# Patient Record
Sex: Female | Born: 1960 | Race: White | Hispanic: No | Marital: Married | Smoking: Never smoker
Health system: Southern US, Community
[De-identification: ages and names within clinical notes are randomized; demographics above are authoritative.]

## PROBLEM LIST (undated history)

## (undated) DIAGNOSIS — I341 Nonrheumatic mitral (valve) prolapse: Secondary | ICD-10-CM

## (undated) DIAGNOSIS — G250 Essential tremor: Secondary | ICD-10-CM

## (undated) DIAGNOSIS — R251 Tremor, unspecified: Secondary | ICD-10-CM

## (undated) HISTORY — DX: Tremor, unspecified: R25.1

## (undated) HISTORY — DX: Nonrheumatic mitral (valve) prolapse: I34.1

## (undated) HISTORY — PX: OTHER SURGICAL HISTORY: SHX169

## (undated) HISTORY — PX: ABDOMINAL HYSTERECTOMY: SHX81

## (undated) HISTORY — PX: SKIN CANCER EXCISION: SHX779

---
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
## 2001-02-01 ENCOUNTER — Other Ambulatory Visit: Admission: RE | Admit: 2001-02-01 | Discharge: 2001-02-01 | Payer: Self-pay | Admitting: Obstetrics and Gynecology

## 2001-02-25 ENCOUNTER — Encounter (INDEPENDENT_AMBULATORY_CARE_PROVIDER_SITE_OTHER): Payer: Self-pay

## 2001-02-25 ENCOUNTER — Other Ambulatory Visit: Admission: RE | Admit: 2001-02-25 | Discharge: 2001-02-25 | Payer: Self-pay | Admitting: Obstetrics and Gynecology

## 2003-04-05 ENCOUNTER — Other Ambulatory Visit: Admission: RE | Admit: 2003-04-05 | Discharge: 2003-04-05 | Payer: Self-pay | Admitting: Obstetrics and Gynecology

## 2003-04-12 ENCOUNTER — Encounter: Admission: RE | Admit: 2003-04-12 | Discharge: 2003-04-12 | Payer: Self-pay | Admitting: Obstetrics and Gynecology

## 2003-04-12 ENCOUNTER — Encounter: Payer: Self-pay | Admitting: Obstetrics and Gynecology

## 2004-04-07 ENCOUNTER — Other Ambulatory Visit: Admission: RE | Admit: 2004-04-07 | Discharge: 2004-04-07 | Payer: Self-pay | Admitting: Obstetrics and Gynecology

## 2005-05-14 ENCOUNTER — Other Ambulatory Visit: Admission: RE | Admit: 2005-05-14 | Discharge: 2005-05-14 | Payer: Self-pay | Admitting: Obstetrics and Gynecology

## 2005-05-29 ENCOUNTER — Encounter (INDEPENDENT_AMBULATORY_CARE_PROVIDER_SITE_OTHER): Payer: Self-pay | Admitting: *Deleted

## 2005-05-30 ENCOUNTER — Inpatient Hospital Stay (HOSPITAL_COMMUNITY): Admission: RE | Admit: 2005-05-30 | Discharge: 2005-05-31 | Payer: Self-pay | Admitting: Obstetrics and Gynecology

## 2006-07-01 ENCOUNTER — Encounter: Admission: RE | Admit: 2006-07-01 | Discharge: 2006-07-01 | Payer: Self-pay | Admitting: Obstetrics and Gynecology

## 2007-05-03 ENCOUNTER — Ambulatory Visit (HOSPITAL_BASED_OUTPATIENT_CLINIC_OR_DEPARTMENT_OTHER): Admission: RE | Admit: 2007-05-03 | Discharge: 2007-05-03 | Payer: Self-pay | Admitting: Orthopedic Surgery

## 2008-03-19 ENCOUNTER — Encounter: Admission: RE | Admit: 2008-03-19 | Discharge: 2008-03-19 | Payer: Self-pay | Admitting: Family Medicine

## 2008-03-29 ENCOUNTER — Encounter: Admission: RE | Admit: 2008-03-29 | Discharge: 2008-03-29 | Payer: Self-pay | Admitting: Family Medicine

## 2008-06-25 ENCOUNTER — Other Ambulatory Visit: Admission: RE | Admit: 2008-06-25 | Discharge: 2008-06-25 | Payer: Self-pay | Admitting: Obstetrics and Gynecology

## 2009-07-09 ENCOUNTER — Other Ambulatory Visit: Admission: RE | Admit: 2009-07-09 | Discharge: 2009-07-09 | Payer: Self-pay | Admitting: Obstetrics and Gynecology

## 2009-07-16 ENCOUNTER — Encounter: Admission: RE | Admit: 2009-07-16 | Discharge: 2009-07-16 | Payer: Self-pay | Admitting: Obstetrics and Gynecology

## 2010-08-01 DIAGNOSIS — C4491 Basal cell carcinoma of skin, unspecified: Secondary | ICD-10-CM

## 2010-08-01 HISTORY — DX: Basal cell carcinoma of skin, unspecified: C44.91

## 2010-08-19 IMAGING — MG MM DIAGNOSTIC BILATERAL
6 series · 6 of 6 positions shown · non-contrast
Comparison: 04/12/2003, 07/01/2006, 03/19/2008

CLINICAL DATA: The patient has been experiencing tenderness
diffusely throughout the right lower outer quadrant intermittently
for 2 weeks.

DIGITAL DIAGNOSTIC  BILATERAL  MAMMOGRAM  WITH CAD AND RIGHT BREAST
ULTRASOUND:

[R CC (1 of 2)]
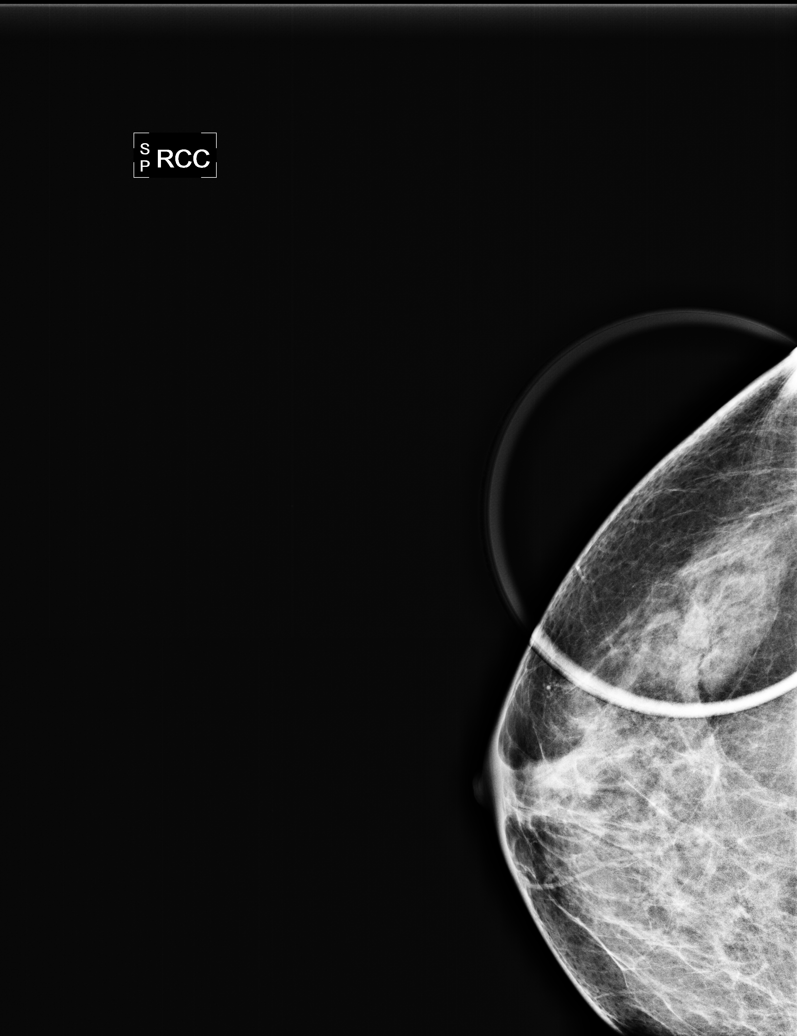

[R CC (2 of 2)]
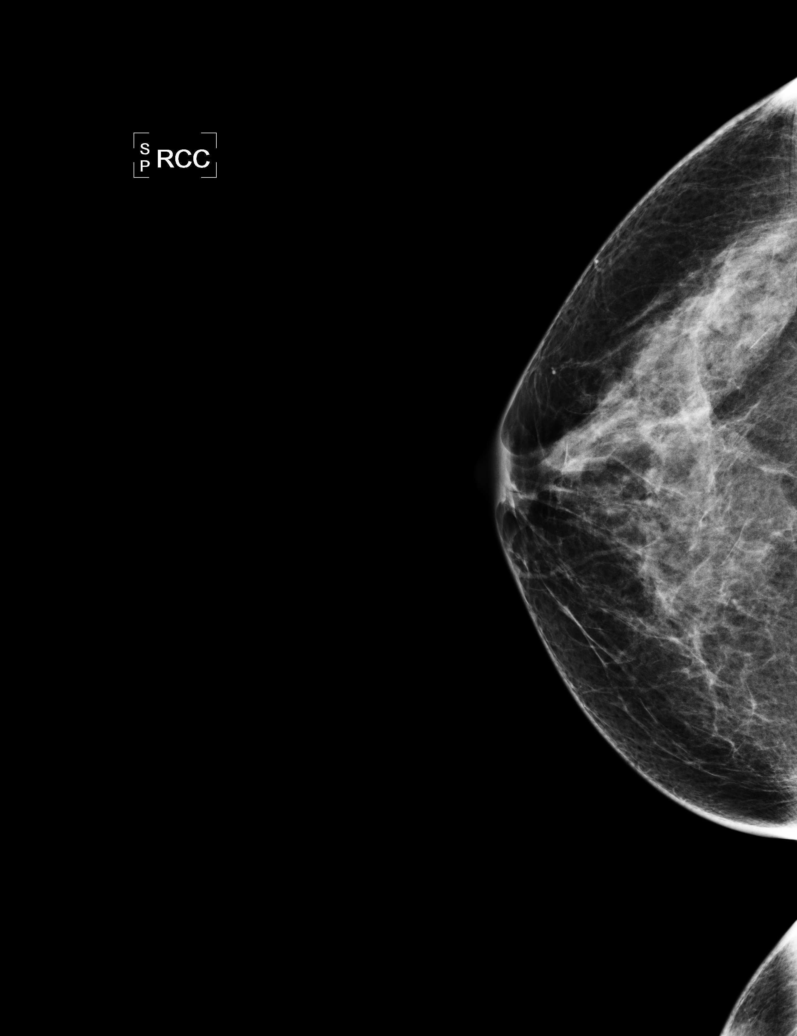

[L CC]
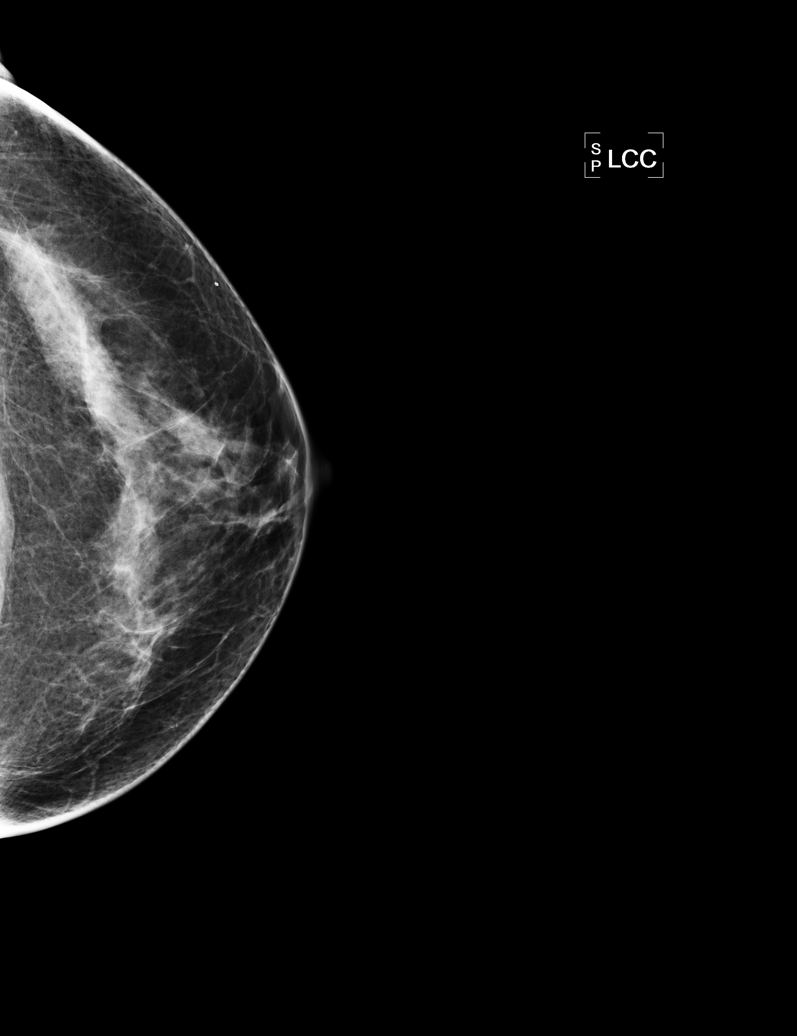

[L MLO]
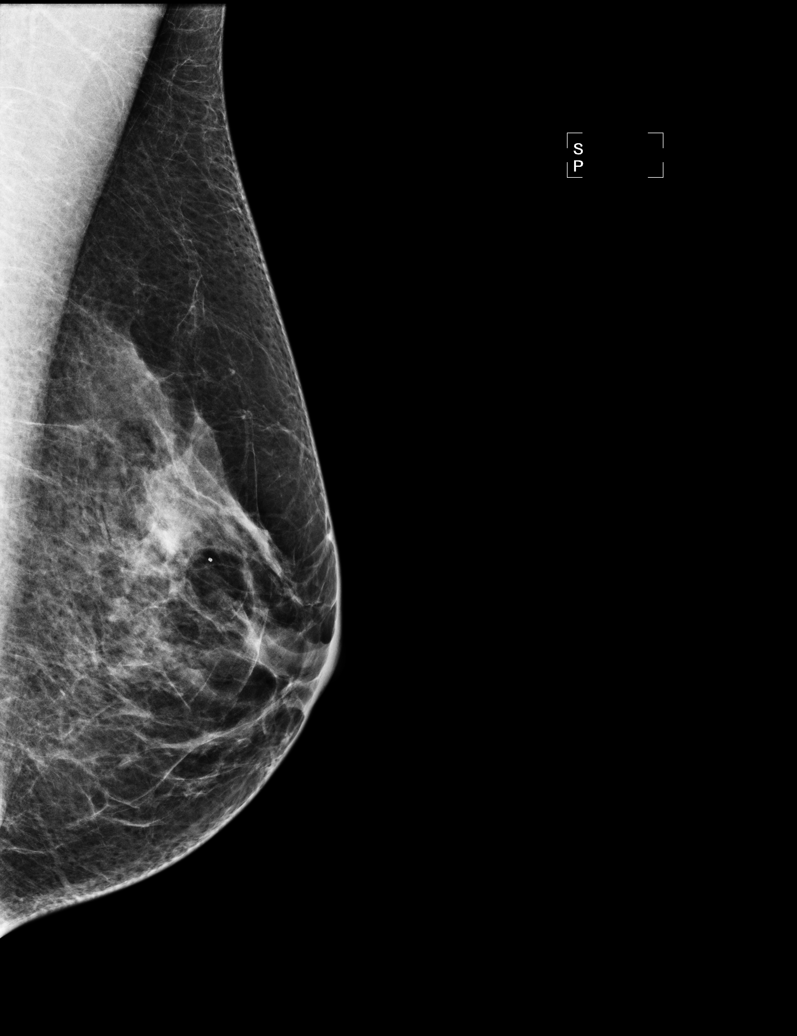

[R MLO (1 of 2)]
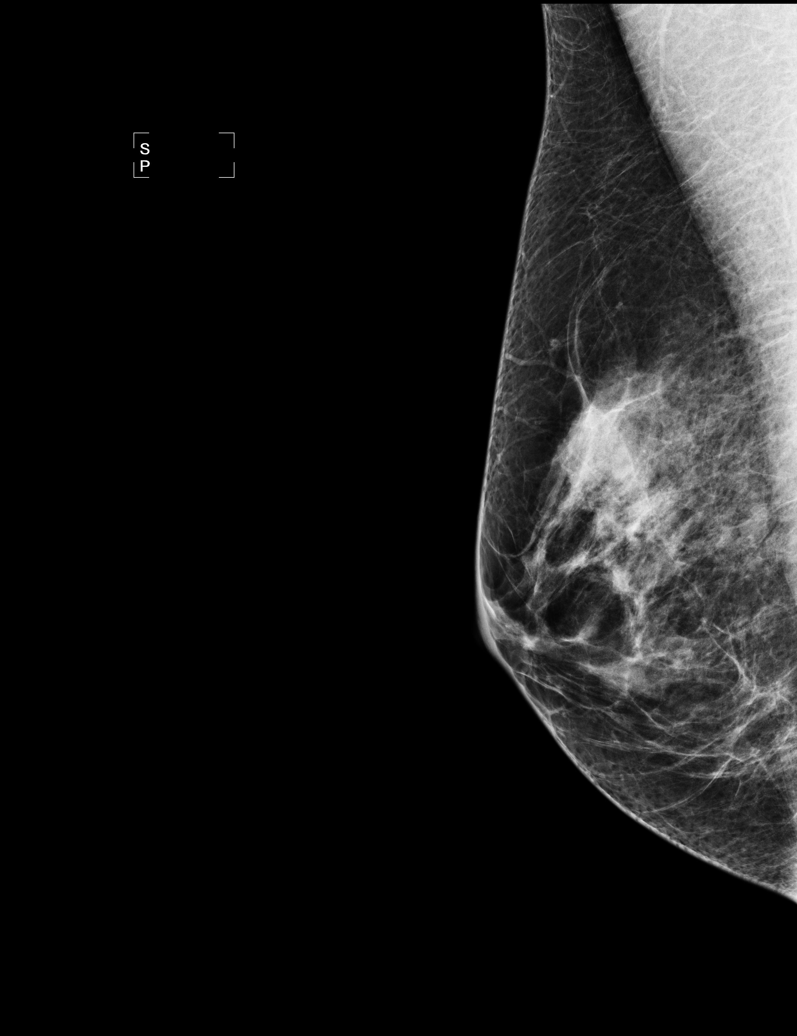

[R MLO (2 of 2)]
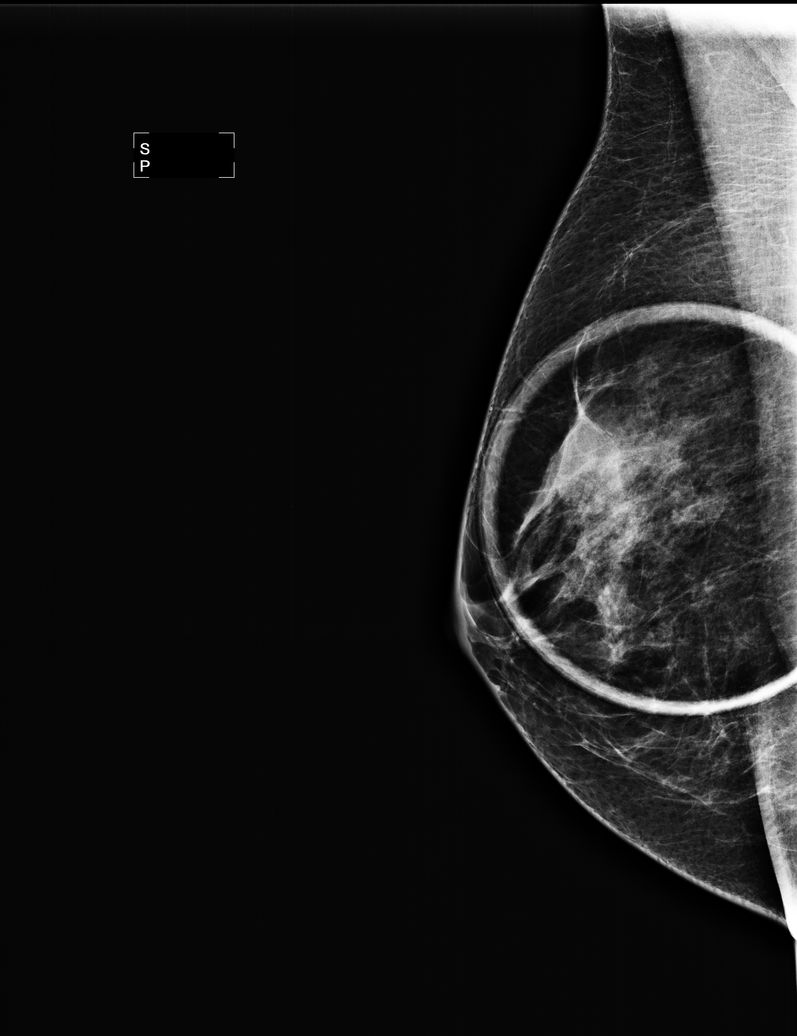

[6 of 6 positions shown; findings below may reference images not displayed]

FINDINGS: There are scattered fibroglandular densities.  There is
an oval, partially obscured, partially circumscribed nodule in the
outer portion of the right breast posteriorly. With the benefit of
retrospection, this has been present on prior exams.  No other
abnormality is noted.
Mammographic images were processed with CAD.

On physical exam, no mass is palpated in the outer portion of the
right breast.

Ultrasound is performed, showing an oval homogeneous well-defined
solid mass at 8:30 o'clock 5 cm from the right nipple, measuring
0.7 x 0.3 x 0.5 cm.  Given the lack of interval change
mammographically dating back to 1228, this is felt to be consistent
with a benign fibroadenoma.  There is no suspicious mass,
distortion or shadowing to suggest malignancy.
IMPRESSION: No mammographic or sonographic evidence of malignancy.  Incidental
note is made of a benign appearing nodule in the right lower outer
quadrant posteriorly which is felt to be consistent with a
fibroadenoma.  Yearly screening mammography is suggested.

BI-RADS CATEGORY 2:  Benign finding(s).

## 2010-09-12 ENCOUNTER — Encounter: Admission: RE | Admit: 2010-09-12 | Discharge: 2010-09-12 | Payer: Self-pay | Admitting: Obstetrics and Gynecology

## 2010-09-26 ENCOUNTER — Encounter: Admission: RE | Admit: 2010-09-26 | Discharge: 2010-09-26 | Payer: Self-pay | Admitting: Obstetrics and Gynecology

## 2010-10-23 ENCOUNTER — Other Ambulatory Visit: Admission: RE | Admit: 2010-10-23 | Discharge: 2010-10-23 | Payer: Self-pay | Admitting: Obstetrics and Gynecology

## 2011-01-04 ENCOUNTER — Encounter: Payer: Self-pay | Admitting: Obstetrics and Gynecology

## 2011-01-04 ENCOUNTER — Encounter: Payer: Self-pay | Admitting: Family Medicine

## 2011-05-01 NOTE — H&P (Signed)
Jill Young, Jill Young             ACCOUNT NO.:  [id number]   MEDICAL RECORD NO.:  [id number]          PATIENT TYPE:  AMB   LOCATION:  SDC                           FACILITY:  [facility]   PHYSICIAN:  James A. Ashley Royalty, M.D.DATE OF BIRTH:  July 05, 1961   DATE OF ADMISSION:  05/28/2005  DATE OF DISCHARGE:                                HISTORY & PHYSICAL   A portion of this history and physical was gleaned from the patient's office  chart.  This is a 50 year old gravida 3, para 3.   CHIEF COMPLAINT:  Abnormal uterine bleeding.   HISTORY OF PRESENT ILLNESS:  The patient presented in December of 2005  complaining of intractable abnormal uterine bleeding.  She was on Ov-Con 35.  She subsequently underwent sonohysterogram on or about April 01, 2005.  The  ultrasound revealed a uterus with multiple fibroids, the largest of which  was 4.9 cm in greatest diameter.  The ovaries were essentially unremarkable.  Sonohysterogram revealed a 1.6 cm submucosal fibroid, anterior and in the  lower uterine segment.  The patient stated a desire for definitive therapy  in the form of laparoscopically-assisted vaginal hysterectomy.   MEDICATIONS:  None.   PAST MEDICAL HISTORY:  Negative.   PAST SURGICAL HISTORY:  BTSP.   ALLERGIES:  No known drug allergies.   FAMILY HISTORY:  Positive for malignant brain tumor.   SOCIAL HISTORY:  The patient denies the use of tobacco or significant  alcohol.   REVIEW OF SYSTEMS:  Noncontributory.   PHYSICAL EXAMINATION:  GENERAL:  A well-developed, well-nourished, pleasant  white female in no acute distress.  VITAL SIGNS:  Afebrile, vital signs stable.  SKIN:  Warm and dry without lesions.  LYMPHS:  There is no supraclavicular, cervical, or inguinal adenopathy.  HEENT:  Normocephalic.  NECK:  Supple without thyromegaly.  CHEST:  Lungs are clear.  HEART:  Regular rate and rhythm without murmurs, rubs, or gallops.  BREASTS:  Soft and without palpable mass, discharge,  retraction, or  adenopathy.  ABDOMEN:  Soft and nontender without masses or organomegaly.  Bowel sounds  are active.  MUSCULOSKELETAL:  Full range of motion without edema, cyanosis, or CVA  tenderness.  PELVIC:  External genitalia within normal limits.  Vagina and cervix are  without gross lesions.  Bimanual examination reveals the uterus to be  approximately 9 x 6 x 6 cm, nodular consistent with fibroids.  Adnexa are  unremarkable.  Rectovaginal examination confirms.   IMPRESSION:  1.  Fibroid uterus.  2.  Abnormal uterine bleeding, refractory to medical management.  3.  Mitral valve prolapse.   PLAN:  Laparoscopically-assisted vaginal hysterectomy with possible  unilateral or bilateral salpingo-oophorectomy.  In the latter event, the  patient understands she would become a candidate for hormone replacement  therapy and agrees to same.  She also understands the possibility of total  abdominal hysterectomy through a laparotomy incision.  Risks, benefits,  complications, and alternatives were fully discussed with the patient.  She  states she understands and accepts.  Questions invited and answered.       JAM/MEDQ  D:  05/28/2005  T:  05/28/2005  Job:  [id number]

## 2011-05-01 NOTE — Op Note (Signed)
NAMESALENE, MOHAMUD             ACCOUNT NO.:  [id number]   MEDICAL RECORD NO.:  [id number]          PATIENT TYPE:  OBV   LOCATION:  9399                          FACILITY:  [facility]   PHYSICIAN:  James A. Ashley Royalty, M.D.DATE OF BIRTH:  August 05, 1961   DATE OF PROCEDURE:  05/29/2005  DATE OF DISCHARGE:                                 OPERATIVE REPORT   PREOPERATIVE DIAGNOSES:  1.  Fibroid uterus.  2.  Abnormal uterine bleeding.   POSTOPERATIVE DIAGNOSES:  1.  Fibroid uterus.  2.  Abnormal uterine bleeding.  3.  Pathology pending.   PROCEDURE:  Laparoscopically-assisted vaginal hysterectomy.   SURGEON:  Rudy Jew. Ashley Royalty, M.D.   ASSISTANT:  Bing Neighbors. Sydnee Cabal, M.D.   ANESTHESIA:  General.   ESTIMATED BLOOD LOSS:  100 mL.   COMPLICATIONS:  None.   PACKS AND DRAINS:  None.   PROCEDURE:  The patient was taken to the operating room and placed in the  dorsal supine position.  After general anesthesia was administered, she was  placed in the lithotomy position and prepped and draped in the usual manner  for abdominal and vaginal surgery.  A posterior weighted retractor was  placed per vagina.  A Hulka tenaculum was placed in the uterus and secured  on the cervix.  A Foley catheter was placed.  A 1.5 cm infraumbilical  incision was made in the longitudinal plane.  A Veress needle was inserted  into the abdominal cavity and its location verified by instillation of  saline and hanging drop techniques.  Approximately 3 L of CO2 were instilled  at 1 L/min. to create a pneumoperitoneum.  Next a size 11 laparoscopic  disposable trocar was placed into the abdominal cavity.  Its location was  verified by placement of the laparoscope.  There was no evidence of any  trauma.  Pneumoperitoneum was maintained throughout with CO2.  Next two 5 mm  suprapubic trocars were placed in the left and right lower quadrants,  respectively.  Direct visualization and transillumination techniques were  employed.  At this point the pelvis was thoroughly surveyed.  The uterus  contained a large fibroid of 5-6 cm emanating from the anterior and fundal  aspect, somewhat deviated to the left side.  The fallopian tubes showed  evidence of previous tubal sterilization procedure.  They were otherwise  normal.  The left ovary was normal size, shape and contour without evidence  of any cysts or endometriosis.  The right ovary was normal size, shape and  contour.  There appeared to be an approximately 4.5 cm cystic follicle on  the ovary.   At this point the tripolar cautery was introduced into the abdominal cavity.  The round ligaments were clamped, coagulated and divided with the tripolar.  The utero-ovarian anastomosis and utero-ovarian ligament and proximal  fallopian tube was grasped with the tripolar, coagulated, and divided with  the tripolar bilaterally.  The anterior uterine serosa was opened to create  a bladder flap.  The bladder was dissected inferiorly.  At this point the  patient was felt to have benefitted maximally from the laparoscopic portion  of  the procedure.   Attention was then turned to the vaginal hysterectomy.  A posterior weighted  retractor was placed.  The Hulka tenaculum was replaced with a Jacobs  tenaculum.  Approximately 20 mL of 1% Xylocaine with epinephrine was  instilled circumferentially along the cervix to obtain hemostasis.  The  anterior aspect of the cervix near the intersection with the vagina was  circumscribed with a knife.  A posterior colpotomy incision was then made.  The Steiner-Auvard retractor was placed in the posterior cul-de-sac.  The  uterosacral ligaments were bilaterally clamped, cut and secured with #1  chromic catgut.  The pedicles were held.  The inferior cul-de-sac was  entered without difficulty.  The cardinal ligament was again clamped, cut,  secured bilaterally with #1 chromic catgut.  The uterine vessels were  clamped, cut and doubly  secured with #1 chromic catgut.  Additional bites  were taken of the uterus to secure additional pedicles.  Each pedicle  incorporated all the tissue from the posterior peritoneum to the anterior  peritoneum.  The pedicles were clamped, cut and secured with #1 chromic  catgut.  Next the posterior corpus was grasped with a single-tooth  tenaculum.  The posterior corpus was delivered through the incision,  including the fibroid.  Two fibrous attachments were easily lysed and the  specimen submitted to pathology for histologic studies.  The pedicles were  inspected and appeared initially hemostatic.   Next a posterior running locking suture of 2-0 Vicryl was placed on the  vaginal cuff to aid in hemostasis.  Hemostasis was noted.  Copious  irrigation was accomplished.  Next the vaginal cuff was closed with  interrupted sutures of 0 chromic catgut in a figure-of-eight fashion.  The  uterosacral ligament pedicles were tied in the midline.  After the vaginal  cuff was closed with the interrupted figure-of-eight sutures, the vaginal  portion of the procedure was terminated.   Attention was then turned to the laparoscopic portion of the procedure.  The  pelvis was visualized with the laparoscope after recreating the  pneumoperitoneum.  There was an arterial bleeder in the right parametrial  area, more than likely a branch of the uterine artery.  This was easily  coagulated with the tripolar cautery.  Copious irrigation was accomplished  with a Nezhat suction irrigator.  Hemostasis was noted.  The ureters were  inspected and noted to be well below the plane of dissection bilaterally.  Copious irrigation was once again accomplished.  Hemostasis was once again  noted.  At this point the patient was felt to have benefitted maximally from  the surgical procedure.  The abdominal instruments were removed, the  pneumoperitoneum evacuated.  The fascial defect was closed with 0 Vicryl in an interrupted  fashion.  The skin was closed with 3-0 Monocryl in  subcuticular fashion.  Approximately 14 mL of 0.25% bupivacaine with  epinephrine was instilled into the incisions to aid in postoperative  analgesia.  The patient tolerated the procedure extremely well.  At the  conclusion of the procedure, the urine was clear and copious.  She was taken  to the recovery room in excellent condition.       JAM/MEDQ  D:  05/29/2005  T:  05/29/2005  Job:  [id number]

## 2011-05-01 NOTE — Op Note (Signed)
Jill Young, BERISH             ACCOUNT NO.:  [id number]   MEDICAL RECORD NO.:  [id number]          PATIENT TYPE:  AMB   LOCATION:  DSC                          FACILITY:  [facility]   PHYSICIAN:  Leonides Grills, M.D.     DATE OF BIRTH:  1961-04-06   DATE OF PROCEDURE:  05/03/2007  DATE OF DISCHARGE:                               OPERATIVE REPORT   PREOPERATIVE DIAGNOSIS:  Left hallux valgus.   POSTOPERATIVE DIAGNOSIS:  Left hallux valgus.   OPERATION:  1. Left Chevron bunionectomy.  2. Stress x-rays left foot.   ANESTHESIA:  General.   SURGEON:  Leonides Grills, MD.   ASSISTANT:  Evlyn Kanner, P.A.   ESTIMATED BLOOD LOSS:  Minimal.   TOURNIQUET TIME:  Approximately 40 minutes.   COMPLICATIONS:  None.   DISPOSITION:  Stable to PR.   INDICATIONS:  A 50 year old female who has had longstanding left bunion  pain despite wearing wider shoes and taking anti-inflammatories.  She  was consented to the above procedure.  All risks which include  infection, nerve or vessel injury, nonunion, malunion, hardware  irritation or hardware failure, stiffness, arthritis, recurrence of  hallux valgus and hallux varus development, prolonged recovery were all  explained.  Questions were encouraged and answered.   OPERATION:  The patient was brought to the operating room placed in  supine position after adequate general endotracheal tube anesthesia was  administered as well as Ancef 1 gram IV piggyback.  Left lower extremity  is prepped and draped in sterile manner over proximally placed thigh  tourniquet.  Limb was gravity exsanguinated, tourniquet elevated to 290  mmHg.  A longitudinal incision midline over medial aspect left great toe  MTP joint was then made.  Dissection was carried down through skin.  Hemostasis was obtained,  neurovascular structures both identified both  superiorly and inferiorly and protected throughout the case.  L-shaped  capsulotomy was then made.  Simple  bunionectomy then performed with  sagittal saw.  Rocky Link Johnson's ridge was then rounded off with a rongeur.  The area and joint copiously irrigated normal saline.  Lateral capsule  was then released with a curved Beaver blade.  This had an excellent  release of the tight capsule.  Center of the head was then identified.  Soft tissue was elevated protected both superiorly and inferiorly and a  Chevron osteotomy was then made with sagittal saw.  Head was then  translated approximately 3-4 mm laterally and fixed with a 2.0-mm fully  threaded cortical set screw using 1.5-mm drill hole respectively.  Screw  had excellent purchase and maintenance of the correction.  We obtained  stress x-ray and there was no gross motion, fixation proper position.  We felt that due to the fact that it was having the lateral cortex, that  we needed to place one more screw, so we placed another 2.0-mm fully  threaded cortical set screw using a 1.5-mm drill hole respectively.  Both the screws were countersunk.  This had excellent purchase and  maintenance of the correction.  We copious irrigated the area joint with  normal saline.  Capsule was  advanced both superiorly and proximally  fixed with 2-0 Vicryl stitch.  This had an outstanding repair with the  toe held in a reduced varus supinated position.  Range of motion toe  after repair was excellent.  Stress x-rays AP and lateral plane showed  no gross motion across fusion site, fixation proper position in  excellent alignment as well.  Sesamoids were located.  Tourniquet  deflated hemostasis was obtained.  The areas copiously irrigated normal  saline.  Skin was closed 4-0 nylon stitch.  Sterile dressing was  applied.  Roger Mann dressing was applied.  Hard sole shoes applied.  Patient stable to PR.   POSTOP COURSE:  The patient follow up two weeks.  At that time will  remove dressing as well as suture.  Will place silicone bunion pad  between the first and second  toe.  She is remain heel weightbearing for  a total of 6 weeks and weightbearing as tolerated hard sole shoe for  additional two weeks and then go into a normal shoe at 2 months postop.  At that point she had discontinue the bunion pad as well.  Iced and scar tissue massage are encouraged over the medial wound and  active passive range of motion excise the great toe MTP joint is  encouraged from 2 weeks onward.      Leonides Grills, M.D.  Electronically Signed     PB/MEDQ  D:  05/03/2007  T:  05/03/2007  Job:  [id number]   cc:   Katrina Stack

## 2011-05-01 NOTE — Discharge Summary (Signed)
NAMECAROLL, WEINHEIMER             ACCOUNT NO.:  [id number]   MEDICAL RECORD NO.:  [id number]          PATIENT TYPE:  INP   LOCATION:  9316                          FACILITY[facility]   PHYSICIAN:  James A. Ashley Royalty, M.D.DATE OF BIRTH:  August 05, 1961   DATE OF ADMISSION:  05/29/2005  DATE OF DISCHARGE:  05/31/2005                                 DISCHARGE SUMMARY   ADMISSION DIAGNOSES:  1.  Fibroid uterus.  2.  Abnormal uterine bleeding.   DISCHARGE DIAGNOSES:  1.  Fibroid uterus.  2.  Abnormal uterine bleeding.  3.  Pathology pending.   PROCEDURES:  Laparoscopic-assisted vaginal hysterectomy.   HISTORY OF PRESENT ILLNESS:  The patient is a 50 year old gravida 3 para 3  who presented to [facility] in December 2005  complaining of intractable abnormal uterine bleeding. At that time she was  on Ovcon 35. She subsequently went to sonohysterogram in April 2006 which  revealed a uterus with multiple fibroids, the largest of which was 4.9. cm  in greatest diameter. Ovaries were essentially unremarkable. Sonohysterogram  revealed a 1.6 cm submucosal fibroid anterior in the lower uterine segment.  The patient desired definitive therapy in the form of a laparoscopic-  assisted vaginal hysterectomy.   PAST SURGICAL HISTORY:  Bilateral tubal sterilization.   No known drug allergies. The patient denies the use of tobacco or  significant alcohol.   HOSPITAL COURSE AND TREATMENT:  The patient was taken to the OR where a  laparoscopic-assisted vaginal hysterectomy was performed by Dr. Ashley Royalty  assisted by Dr. Loreli Dollar under general anesthesia. Estimated blood  loss was 100 mL. The patient tolerated the procedure well and postoperative  course was benign and she was able to be discharged in satisfactory  condition on May 27, 2005.   DISPOSITION:  Follow up in the office in 6 weeks.      Elwyn Lade   MKH/MEDQ  D:  06/24/2005  T:  06/24/2005  Job:  [id number]

## 2012-01-01 ENCOUNTER — Other Ambulatory Visit: Payer: Self-pay | Admitting: Obstetrics and Gynecology

## 2012-01-01 ENCOUNTER — Other Ambulatory Visit (HOSPITAL_COMMUNITY)
Admission: RE | Admit: 2012-01-01 | Discharge: 2012-01-01 | Disposition: A | Discharge status: Home / Self Care | Payer: 59 | Source: Ambulatory Visit | Attending: Obstetrics and Gynecology | Admitting: Obstetrics and Gynecology

## 2012-01-01 DIAGNOSIS — Z01419 Encounter for gynecological examination (general) (routine) without abnormal findings: Secondary | ICD-10-CM | POA: Insufficient documentation

## 2012-01-20 ENCOUNTER — Other Ambulatory Visit: Payer: Self-pay | Admitting: Dermatology

## 2012-01-20 DIAGNOSIS — C4491 Basal cell carcinoma of skin, unspecified: Secondary | ICD-10-CM | POA: Insufficient documentation

## 2012-02-01 ENCOUNTER — Other Ambulatory Visit: Payer: Self-pay | Admitting: Obstetrics and Gynecology

## 2012-02-01 DIAGNOSIS — Z1231 Encounter for screening mammogram for malignant neoplasm of breast: Secondary | ICD-10-CM

## 2012-02-02 ENCOUNTER — Ambulatory Visit
Admission: RE | Admit: 2012-02-02 | Discharge: 2012-02-02 | Disposition: A | Discharge status: Home / Self Care | Payer: 59 | Source: Ambulatory Visit | Attending: Obstetrics and Gynecology | Admitting: Obstetrics and Gynecology

## 2012-02-02 DIAGNOSIS — Z1231 Encounter for screening mammogram for malignant neoplasm of breast: Secondary | ICD-10-CM

## 2013-04-03 ENCOUNTER — Other Ambulatory Visit: Payer: Self-pay | Admitting: Obstetrics and Gynecology

## 2013-04-03 DIAGNOSIS — Z1231 Encounter for screening mammogram for malignant neoplasm of breast: Secondary | ICD-10-CM

## 2013-04-05 ENCOUNTER — Ambulatory Visit (HOSPITAL_BASED_OUTPATIENT_CLINIC_OR_DEPARTMENT_OTHER): Payer: 59

## 2014-01-16 ENCOUNTER — Ambulatory Visit
Admission: RE | Admit: 2014-01-16 | Discharge: 2014-01-16 | Disposition: A | Discharge status: Home / Self Care | Payer: Self-pay | Source: Ambulatory Visit | Attending: Obstetrics and Gynecology | Admitting: Obstetrics and Gynecology

## 2014-01-16 DIAGNOSIS — Z1231 Encounter for screening mammogram for malignant neoplasm of breast: Secondary | ICD-10-CM

## 2014-11-22 ENCOUNTER — Other Ambulatory Visit: Payer: Self-pay | Admitting: Obstetrics and Gynecology

## 2014-11-22 DIAGNOSIS — N644 Mastodynia: Secondary | ICD-10-CM

## 2014-11-22 DIAGNOSIS — N63 Unspecified lump in unspecified breast: Secondary | ICD-10-CM

## 2014-12-04 ENCOUNTER — Ambulatory Visit
Admission: RE | Admit: 2014-12-04 | Discharge: 2014-12-04 | Disposition: A | Discharge status: Home / Self Care | Payer: 59 | Source: Ambulatory Visit | Attending: Obstetrics and Gynecology | Admitting: Obstetrics and Gynecology

## 2014-12-04 DIAGNOSIS — N63 Unspecified lump in unspecified breast: Secondary | ICD-10-CM

## 2014-12-04 DIAGNOSIS — N644 Mastodynia: Secondary | ICD-10-CM

## 2016-10-20 ENCOUNTER — Other Ambulatory Visit: Payer: Self-pay | Admitting: Obstetrics and Gynecology

## 2016-10-20 DIAGNOSIS — Z1231 Encounter for screening mammogram for malignant neoplasm of breast: Secondary | ICD-10-CM

## 2016-12-08 ENCOUNTER — Ambulatory Visit
Admission: RE | Admit: 2016-12-08 | Discharge: 2016-12-08 | Disposition: A | Discharge status: Home / Self Care | Payer: BLUE CROSS/BLUE SHIELD | Source: Ambulatory Visit | Attending: Obstetrics and Gynecology | Admitting: Obstetrics and Gynecology

## 2016-12-08 DIAGNOSIS — Z1231 Encounter for screening mammogram for malignant neoplasm of breast: Secondary | ICD-10-CM

## 2018-03-07 ENCOUNTER — Other Ambulatory Visit: Payer: Self-pay | Admitting: Obstetrics and Gynecology

## 2018-03-07 DIAGNOSIS — Z1231 Encounter for screening mammogram for malignant neoplasm of breast: Secondary | ICD-10-CM

## 2018-03-24 ENCOUNTER — Other Ambulatory Visit: Payer: Self-pay | Admitting: Obstetrics and Gynecology

## 2018-03-24 DIAGNOSIS — N644 Mastodynia: Secondary | ICD-10-CM

## 2018-03-28 ENCOUNTER — Other Ambulatory Visit: Payer: BLUE CROSS/BLUE SHIELD

## 2018-04-04 ENCOUNTER — Ambulatory Visit
Admission: RE | Admit: 2018-04-04 | Discharge: 2018-04-04 | Disposition: A | Discharge status: Home / Self Care | Payer: No Typology Code available for payment source | Source: Ambulatory Visit | Attending: Obstetrics and Gynecology | Admitting: Obstetrics and Gynecology

## 2018-04-04 DIAGNOSIS — N644 Mastodynia: Secondary | ICD-10-CM

## 2018-06-01 ENCOUNTER — Other Ambulatory Visit: Payer: Self-pay

## 2018-06-01 ENCOUNTER — Telehealth: Payer: Self-pay | Admitting: Neurology

## 2018-06-01 ENCOUNTER — Encounter: Payer: Self-pay | Admitting: Neurology

## 2018-06-01 ENCOUNTER — Ambulatory Visit: Payer: Self-pay | Admitting: Neurology

## 2018-06-01 VITALS — BP 122/81 | HR 74 | Ht 63.0 in | Wt 135.0 lb

## 2018-06-01 DIAGNOSIS — R202 Paresthesia of skin: Secondary | ICD-10-CM

## 2018-06-01 DIAGNOSIS — Z5181 Encounter for therapeutic drug level monitoring: Secondary | ICD-10-CM

## 2018-06-01 DIAGNOSIS — R251 Tremor, unspecified: Secondary | ICD-10-CM

## 2018-06-01 NOTE — Telephone Encounter (Signed)
self pay order sent to [facility] . They will reach out to the pt to schedule.  °

## 2018-06-01 NOTE — Progress Notes (Signed)
Reason for visit: Tremor, paresthesias  Referring physician: Dr. Jone Baseman is a 57 y.o. female  History of present illness:  Ms. Glandon is a 57 year old right-handed white female with a history of onset of tremor involving the right upper extremity that occurred about 1 year ago.  Around the same time she noted some intermittent paresthesias involving the right hand as well.  The paresthesias and the tremor do not always come on together, but occasionally they will.  The patient more recently has noted when she tries to run and exercise, the tremor and the paresthesias may occur together.  She has not noticed any tremor with the left hand.  When the paresthesias come on they may last several minutes and then go away, the tremor will go away if she focuses on the hand or changes the position of the hand.  The tremor is noted not at rest but when she is doing something with the hand.  She has not had any change in handwriting, she denies any issues with feeding herself.  She reports that her father had an essential tremor when he was in his 34s.  The patient has had a skiing accident with a neck injury in the past, she reports some neck discomfort but she denies any radicular pain down the right arm.  The paresthesias and numbness are only involving the hand, not going up the arm.  She denies any increased symptoms down the right arm with turning her head from one side to the next.  She has not noted any significant change in balance, she reports some urinary urgency and some occasional incontinence.  She denies any weakness of the extremities.  She has not had any visual complaints, dizziness, or headache.  She is sent to this office for an evaluation.  Occasionally in the past she has noted episodes of vibration sensation on the upper thigh on the right, this is no longer occurring.  Past Medical History:  Diagnosis Date  . Mitral valve prolapse   . Tremor    Right hand     Past Surgical History:  Procedure Laterality Date  . ABDOMINAL HYSTERECTOMY    . Right foot surgery     Bunionectomy  . SKIN CANCER EXCISION     Basal cell carcinoma, nose    Family History  Problem Relation Age of Onset  . Tremor Father   . Cancer Sister        Neuroendocrine tumor    Social history:  reports that she has never smoked. She has never used smokeless tobacco. She reports that she drinks alcohol. She reports that she does not use drugs.  Medications:  Prior to Admission medications   Medication Sig Start Date End Date Taking? Authorizing Provider  estradiol (ESTRACE) 1 MG tablet Take 1 mg by mouth daily.   Yes [provider]  oxybutynin (DITROPAN-XL) 10 MG 24 hr tablet Take 10 mg by mouth at bedtime.   Yes [provider]     No Known Allergies  ROS:  Out of a complete 14 system review of symptoms, the patient complains only of the following symptoms, and all other reviewed systems are negative.  Weight loss, fatigue Chest pain Tremor Insomnia  Blood pressure 122/81, pulse 74, height 5\' 3"  (1.6 m), weight 135 lb (61.2 kg).  Physical Exam  General: The patient is alert and cooperative at the time of the examination.  Eyes: Pupils are equal, round, and reactive to  light. Discs are flat bilaterally.  Neck: The neck is supple, no carotid bruits are noted.  Respiratory: The respiratory examination is clear.  Cardiovascular: The cardiovascular examination reveals a regular rate and rhythm, no obvious murmurs or rubs are noted.  Neuromuscular: Range of movement of the cervical spine is relatively full.  Skin: Extremities are without significant edema.  Neurologic Exam  Mental status: The patient is alert and oriented x 3 at the time of the examination. The patient has apparent normal recent and remote memory, with an apparently normal attention span and concentration ability.  Cranial nerves: Facial symmetry is present. There  is good sensation of the face to pinprick and soft touch bilaterally. The strength of the facial muscles and the muscles to head turning and shoulder shrug are normal bilaterally. Speech is well enunciated, no aphasia or dysarthria is noted. Extraocular movements are full. Visual fields are full. The tongue is midline, and the patient has symmetric elevation of the soft palate. No obvious hearing deficits are noted.  Motor: The motor testing reveals 5 over 5 strength of all 4 extremities. Good symmetric motor tone is noted throughout.  Sensory: Sensory testing is intact to pinprick, soft touch, vibration sensation, and position sense on all 4 extremities. No evidence of extinction is noted.  Coordination: Cerebellar testing reveals good finger-nose-finger and heel-to-shin bilaterally.  Tinel sign at the wrists are negative bilaterally.  Gait and station: Gait is normal. Tandem gait is normal. Romberg is negative. No drift is seen.  Reflexes: Deep tendon reflexes are symmetric and normal bilaterally. Toes are downgoing bilaterally.   Assessment/Plan:  1.  Right upper extremity tremor, paresthesias of the right hand  Etiology of the above symptoms is not clear.  Tremor events are usually initiated by a central nervous system problem, and are usually not associated with cervical radiculopathy or carpal tunnel syndrome.  Tremor may occur with muscle fatigue as well.  The patient will be sent for MRI evaluation of the brain to exclude demyelinating disease.  If this is unremarkable, we may consider EMG and nerve conduction study in the future.  We will follow the patient over time, she will come back in about 6 months.  Blood work will be done today to document kidney function.  Jill Alexanders MD 06/01/2018 9:13 AM  [facility] [address] [place], [place] [address]  Phone [phone] Fax [phone]

## 2018-06-02 ENCOUNTER — Telehealth: Payer: Self-pay | Admitting: *Deleted

## 2018-06-02 LAB — COMPREHENSIVE METABOLIC PANEL
ALT: 15 IU/L (ref 0–32)
AST: 19 IU/L (ref 0–40)
Albumin/Globulin Ratio: 1.7 (ref 1.2–2.2)
Albumin: 4.5 g/dL (ref 3.5–5.5)
Alkaline Phosphatase: 50 IU/L (ref 39–117)
BUN/Creatinine Ratio: 17 (ref 9–23)
BUN: 14 mg/dL (ref 6–24)
Bilirubin Total: 0.2 mg/dL (ref 0.0–1.2)
CALCIUM: 9.8 mg/dL (ref 8.7–10.2)
CO2: 24 mmol/L (ref 20–29)
CREATININE: 0.82 mg/dL (ref 0.57–1.00)
Chloride: 104 mmol/L (ref 96–106)
GFR calc Af Amer: 92 mL/min/{1.73_m2} (ref >59)
GFR, EST NON AFRICAN AMERICAN: 80 mL/min/{1.73_m2} (ref >59)
GLOBULIN, TOTAL: 2.6 g/dL (ref 1.5–4.5)
Glucose: 96 mg/dL (ref 65–99)
Potassium: 4.5 mmol/L (ref 3.5–5.2)
Sodium: 144 mmol/L (ref 134–144)
Total Protein: 7.1 g/dL (ref 6.0–8.5)

## 2018-06-02 NOTE — Telephone Encounter (Signed)
Spoke with patient and informed her that her labs are normal. She then stated she is waiting to get call about scheduling her MRI. This RN advised her the order was sent to [facility], and if she doesn't hear from them in a few days, she can call to schedule. She verbalized understanding, appreciation.

## 2018-06-15 ENCOUNTER — Ambulatory Visit
Admission: RE | Admit: 2018-06-15 | Discharge: 2018-06-15 | Disposition: A | Discharge status: Home / Self Care | Payer: No Typology Code available for payment source | Source: Ambulatory Visit | Attending: Neurology | Admitting: Neurology

## 2018-06-15 DIAGNOSIS — R202 Paresthesia of skin: Secondary | ICD-10-CM

## 2018-06-15 DIAGNOSIS — R251 Tremor, unspecified: Secondary | ICD-10-CM

## 2018-06-15 MED ORDER — GADOBENATE DIMEGLUMINE 529 MG/ML IV SOLN
12.0000 mL | Freq: Once | INTRAVENOUS | Status: AC | PRN
  Administered 2018-06-15: 12 mL via INTRAVENOUS

## 2018-06-17 ENCOUNTER — Telehealth: Payer: Self-pay | Admitting: Neurology

## 2018-06-17 DIAGNOSIS — R202 Paresthesia of skin: Secondary | ICD-10-CM

## 2018-06-17 NOTE — Telephone Encounter (Signed)
I called the patient, left a message.  The MRI of the brain is relatively unremarkable, occasional white matter changes in the brain that are minimal, this does not explain her right arm symptoms.  As we discussed in office, I will get EMG nerve conduction study evaluation of the right arm.   MRI brain 06/17/18:  IMPRESSION:   MRI brain (with and without) demonstrating: - Few scattered periventricular and subcortical foci of non-specific T2 hyperintensities. No abnormal lesions on post-contrast views.  - No acute findings.

## 2018-08-08 ENCOUNTER — Encounter: Payer: No Typology Code available for payment source | Admitting: Neurology

## 2018-11-25 ENCOUNTER — Ambulatory Visit: Payer: No Typology Code available for payment source | Admitting: Neurology

## 2019-02-17 ENCOUNTER — Encounter: Payer: Self-pay | Admitting: *Deleted

## 2019-02-17 ENCOUNTER — Emergency Department (INDEPENDENT_AMBULATORY_CARE_PROVIDER_SITE_OTHER)
Admission: EM | Admit: 2019-02-17 | Discharge: 2019-02-17 | Disposition: A | Discharge status: Home / Self Care | Payer: PRIVATE HEALTH INSURANCE | Source: Home / Self Care

## 2019-02-17 ENCOUNTER — Other Ambulatory Visit: Payer: Self-pay

## 2019-02-17 DIAGNOSIS — T2122XS Burn of second degree of abdominal wall, sequela: Secondary | ICD-10-CM | POA: Diagnosis not present

## 2019-02-17 MED ORDER — SILVER SULFADIAZINE 1 % EX CREA: 1.0000 "application " | TOPICAL_CREAM | Freq: Every day | CUTANEOUS | 0 refills | Status: AC

## 2019-02-17 MED ORDER — SULFAMETHOXAZOLE-TRIMETHOPRIM 800-160 MG PO TABS: 1.0000 | ORAL_TABLET | Freq: Two times a day (BID) | ORAL | 0 refills | Status: AC

## 2019-02-17 NOTE — Discharge Instructions (Signed)
°  Keep wound clean with warm water and mild soap. Pat dry. Use the silvadene ointment once daily and cover with a bandage to keep protected as the wound heals. You may change the bandage 2-3 times daily as needed. Do not use any other topical medications such as neosporin.   Please follow up in 3-4 days if not improving, sooner if worsening.

## 2019-02-17 NOTE — ED Triage Notes (Signed)
Pt is here today for a burn on her abd that happened 10 days ago after she dropped a curling iron on it. She saw her PCP on 02/15/19 was given Keflex.

## 2019-02-17 NOTE — ED Provider Notes (Signed)
[facility]    CSN: [id number] Arrival date & time: 02/17/19  1352     History   Chief Complaint Chief Complaint  Patient presents with  . Burn    HPI Jill Young is a 58 y.o. female.   HPI Jill Young is a 58 y.o. female presenting to UC with c/o worsening burn to her abdomen that occurred about 10 days ago. Pt accidentally dropped her curling iron and reflexively attempted to catch it, hitting her abdomen. She was seen by her PCP 4 days ago, tx with Keflex 500mg  BID after the burn became infected. She has also been using OTC neosporin but is concerned she has not seen any improvement. She called her PCP who recommended she be evaluated today, they did not have an appointments available. Denies fever or chills but has had nausea. She also has mild soreness that feels deeper than the current burn.   Past Medical History:  Diagnosis Date  . Mitral valve prolapse   . Tremor    Right hand    There are no active problems to display for this patient.   Past Surgical History:  Procedure Laterality Date  . ABDOMINAL HYSTERECTOMY    . Right foot surgery     Bunionectomy  . SKIN CANCER EXCISION     Basal cell carcinoma, nose    OB History   No obstetric history on file.      Home Medications    Prior to Admission medications   Medication Sig Start Date End Date Taking? Authorizing Provider  cephALEXin (KEFLEX) 500 MG capsule Take 500 mg by mouth 4 (four) times daily.   Yes [provider]  estradiol (ESTRACE) 1 MG tablet Take 1 mg by mouth daily.    [provider]  silver sulfADIAZINE (SILVADENE) 1 % cream Apply 1 application topically daily. For up to 7 days 02/17/19   Noe Gens, PA-C  sulfamethoxazole-trimethoprim (BACTRIM DS,SEPTRA DS) 800-160 MG tablet Take 1 tablet by mouth 2 (two) times daily for 7 days. 02/17/19 02/24/19  Noe Gens, PA-C    Family History Family History  Problem Relation Age of Onset  . Tremor  Father   . Cancer Sister        Neuroendocrine tumor    Social History Social History   Tobacco Use  . Smoking status: Never Smoker  . Smokeless tobacco: Never Used  Substance Use Topics  . Alcohol use: Yes    Comment: 1 drink per week  . Drug use: Never     Allergies   Patient has no known allergies.   Review of Systems Review of Systems  Constitutional: Negative for chills and fever.  Gastrointestinal: Positive for abdominal pain. Negative for diarrhea, nausea and vomiting.  Skin: Positive for color change and wound.     Physical Exam Triage Vital Signs ED Triage Vitals  Enc Vitals Group     BP 02/17/19 1419 132/77     Pulse Rate 02/17/19 1419 75     Resp 02/17/19 1419 18     Temp 02/17/19 1419 97.7 F (36.5 C)     Temp Source 02/17/19 1419 Oral     SpO2 02/17/19 1419 99 %     Weight 02/17/19 1420 139 lb (63 kg)     Height 02/17/19 1420 5\' 3"  (1.6 m)     Head Circumference --      Peak Flow --      Pain Score 02/17/19 1420 3  Pain Loc --      Pain Edu? --      Excl. in [place]? --    No data found.  Updated Vital Signs BP 132/77 (BP Location: Right Arm)   Pulse 75   Temp 97.7 F (36.5 C) (Oral)   Resp 18   Ht 5\' 3"  (1.6 m)   Wt 139 lb (63 kg)   SpO2 99%   BMI 24.62 kg/m   Visual Acuity Right Eye Distance:   Left Eye Distance:   Bilateral Distance:    Right Eye Near:   Left Eye Near:    Bilateral Near:     Physical Exam Vitals signs and nursing note reviewed.  Constitutional:      Appearance: She is well-developed.  HENT:     Head: Normocephalic and atraumatic.  Neck:     Musculoskeletal: Normal range of motion.  Cardiovascular:     Rate and Rhythm: Normal rate.  Pulmonary:     Effort: Pulmonary effort is normal.  Abdominal:       Comments: Tenderness around burns.  Musculoskeletal: Normal range of motion.  Skin:    General: Skin is warm and dry.  Neurological:     Mental Status: She is alert and oriented to person, place,  and time.  Psychiatric:        Behavior: Behavior normal.      UC Treatments / Results  Labs (all labs ordered are listed, but only abnormal results are displayed) Labs Reviewed - No data to display  EKG None  Radiology No results found.  Procedures Procedures (including critical care time)  Medications Ordered in UC Medications - No data to display  Initial Impression / Assessment and Plan / UC Course  I have reviewed the triage vital signs and the nursing notes.  Pertinent labs & imaging results that were available during my care of the patient were reviewed by me and considered in my medical decision making (see chart for details).     Concern for worsening infection despite being on Keflex BID Will start pt on Keflex Wound cleaned with saline, silvadene and dressing applied Bactrim and silvadene sent to pharmacy Pt to continue the keflex in addition to the bactrim F/u in 3-4 days   Final Clinical Impressions(s) / UC Diagnoses   Final diagnoses:  Partial thickness burn of abdomen, sequela     Discharge Instructions      Keep wound clean with warm water and mild soap. Pat dry. Use the silvadene ointment once daily and cover with a bandage to keep protected as the wound heals. You may change the bandage 2-3 times daily as needed. Do not use any other topical medications such as neosporin.   Please follow up in 3-4 days if not improving, sooner if worsening.      ED Prescriptions    Medication Sig Dispense Auth. Provider   sulfamethoxazole-trimethoprim (BACTRIM DS,SEPTRA DS) 800-160 MG tablet Take 1 tablet by mouth 2 (two) times daily for 7 days. 14 tablet Leeroy Cha O, PA-C   silver sulfADIAZINE (SILVADENE) 1 % cream Apply 1 application topically daily. For up to 7 days 25 g Noe Gens, PA-C     Controlled Substance Prescriptions [place] Controlled Substance Registry consulted? Not Applicable   Tyrell Antonio 02/17/19 1441

## 2019-05-10 DIAGNOSIS — C44311 Basal cell carcinoma of skin of nose: Secondary | ICD-10-CM

## 2019-05-24 ENCOUNTER — Telehealth: Payer: Self-pay | Admitting: Neurology

## 2019-05-24 NOTE — Telephone Encounter (Signed)
Due to current COVID 19 pandemic, our office is severely reducing in office visits until further notice, in order to minimize the risk to our patients and healthcare providers.   Called patient regarding her 6/15 appt. Intended to offer patient a virtual visit. Patient picked up the phone and I could not hear the patient due to static. I called the patient back and still was unable to hear patient. If patient calls back please offer VV for appointment. I will try to call patient back at a later time.

## 2019-05-24 NOTE — Telephone Encounter (Signed)
error 

## 2019-05-24 NOTE — Telephone Encounter (Signed)
Patient called back and gave consent to schedule a virtual visit for 6/15 appt. I have sent her an e-mail with link and instructions and my office contact information for reference.   Pt understands that although there may be some limitations with this type of visit, we will take all precautions to reduce any security or privacy concerns.  Pt understands that this will be treated like an in office visit and we will file with pt's insurance, and there may be a patient responsible charge related to this service.

## 2019-05-25 NOTE — Telephone Encounter (Signed)
I contacted the pt and left a vm requesting her to call me back so we could complete pre charting for 05/29/19 visit. Pt was advised of [facility]'s # and hrs.

## 2019-05-29 ENCOUNTER — Ambulatory Visit (INDEPENDENT_AMBULATORY_CARE_PROVIDER_SITE_OTHER): Payer: PRIVATE HEALTH INSURANCE | Admitting: Neurology

## 2019-05-29 ENCOUNTER — Other Ambulatory Visit: Payer: Self-pay

## 2019-05-29 ENCOUNTER — Encounter: Payer: Self-pay | Admitting: Neurology

## 2019-05-29 ENCOUNTER — Encounter

## 2019-05-29 DIAGNOSIS — G250 Essential tremor: Secondary | ICD-10-CM | POA: Diagnosis not present

## 2019-05-29 HISTORY — DX: Essential tremor: G25.0

## 2019-05-29 NOTE — Progress Notes (Signed)
      Virtual Visit via Video Note  I connected with Jill Young on 05/29/19 at  8:00 AM EDT by a video enabled telemedicine application and verified that I am speaking with the correct person using two identifiers.  Location: Patient: The patient is at home. Provider: Physician in office.   I discussed the limitations of evaluation and management by telemedicine and the availability of in person appointments. The patient expressed understanding and agreed to proceed.  History of Present Illness: Jill Young is a 58 year old right-handed white female with a history of intermittent tremors.  The patient was seen about a year ago, she was set up for MRI of the brain that showed minimal white matter changes, not consistent with multiple sclerosis.  The patient indicated that around the time of symptoms, she was having increased anxiety and stress associated with a psychiatric illness with her daughter.  She has noted that since the anxiety levels have dropped and she has reduced her caffeine intake, the tremors are less prominent.  The tremors are with activity of the hands, not at rest.  The patient has also had some intermittent paresthesias that tend to affect the right greater than left hand, these episodes have become less frequent and less severe.  They are often times brought on by exercise, the patient may run for 5 miles and at the very end of the run may note some mild tingling in the hands.  She does not note any numbness elsewhere.  She was set up for EMG nerve conduction study but this was never done.  The patient decided that she wanted to hold off on further testing.   Observations/Objective: The virtual evaluation reveals that the patient is alert and cooperative.  Speech is well enunciated, not aphasic or dysarthric.  Face is symmetric, extraocular movements are full.  Assessment and Plan: 1.  Intermittent tremors, probable essential tremor  2.  Intermittent  paresthesias of the hands, right greater than left  The patient does have a family history of essential tremor, her father had a tremor.  The patient likely has an essential tremor that may worsen gradually as she gets older.  For now, we will offer no treatment.  The patient will contact our office if the paresthesias in the hands worsen, we may perform an EMG and nerve conduction study at that time.  For now, the patient will follow-up on an as-needed basis.  Follow Up Instructions:    I discussed the assessment and treatment plan with the patient. The patient was provided an opportunity to ask questions and all were answered. The patient agreed with the plan and demonstrated an understanding of the instructions.   The patient was advised to call back or seek an in-person evaluation if the symptoms worsen or if the condition fails to improve as anticipated.  I provided 15 minutes of non-face-to-face time during this encounter.   Kathrynn Ducking, MD

## 2020-07-22 ENCOUNTER — Other Ambulatory Visit: Payer: Self-pay | Admitting: Obstetrics and Gynecology

## 2020-07-22 DIAGNOSIS — Z1231 Encounter for screening mammogram for malignant neoplasm of breast: Secondary | ICD-10-CM

## 2020-08-23 ENCOUNTER — Ambulatory Visit
Admission: RE | Admit: 2020-08-23 | Discharge: 2020-08-23 | Disposition: A | Discharge status: Home / Self Care | Payer: PRIVATE HEALTH INSURANCE | Source: Ambulatory Visit | Attending: Obstetrics and Gynecology | Admitting: Obstetrics and Gynecology

## 2020-08-23 ENCOUNTER — Other Ambulatory Visit: Payer: Self-pay

## 2020-08-23 DIAGNOSIS — Z1231 Encounter for screening mammogram for malignant neoplasm of breast: Secondary | ICD-10-CM

## 2021-10-09 ENCOUNTER — Other Ambulatory Visit: Payer: Self-pay | Admitting: Obstetrics and Gynecology

## 2021-10-09 DIAGNOSIS — Z1231 Encounter for screening mammogram for malignant neoplasm of breast: Secondary | ICD-10-CM

## 2021-12-17 ENCOUNTER — Other Ambulatory Visit: Payer: Self-pay

## 2021-12-17 ENCOUNTER — Ambulatory Visit
Admission: RE | Admit: 2021-12-17 | Discharge: 2021-12-17 | Disposition: A | Discharge status: Home / Self Care | Payer: PRIVATE HEALTH INSURANCE | Source: Ambulatory Visit | Attending: Obstetrics and Gynecology | Admitting: Obstetrics and Gynecology

## 2021-12-17 DIAGNOSIS — Z1231 Encounter for screening mammogram for malignant neoplasm of breast: Secondary | ICD-10-CM

## 2022-12-21 ENCOUNTER — Other Ambulatory Visit: Payer: Self-pay | Admitting: Obstetrics and Gynecology

## 2022-12-21 DIAGNOSIS — Z1231 Encounter for screening mammogram for malignant neoplasm of breast: Secondary | ICD-10-CM

## 2023-01-13 ENCOUNTER — Ambulatory Visit: Payer: PRIVATE HEALTH INSURANCE

## 2023-01-13 ENCOUNTER — Ambulatory Visit
Admission: RE | Admit: 2023-01-13 | Discharge: 2023-01-13 | Disposition: A | Discharge status: Home / Self Care | Payer: No Typology Code available for payment source | Source: Ambulatory Visit | Attending: Obstetrics and Gynecology | Admitting: Obstetrics and Gynecology

## 2023-01-13 DIAGNOSIS — Z1231 Encounter for screening mammogram for malignant neoplasm of breast: Secondary | ICD-10-CM

## 2024-03-06 ENCOUNTER — Other Ambulatory Visit: Payer: Self-pay | Admitting: Obstetrics and Gynecology

## 2024-03-06 DIAGNOSIS — Z1231 Encounter for screening mammogram for malignant neoplasm of breast: Secondary | ICD-10-CM

## 2024-03-22 ENCOUNTER — Ambulatory Visit
Admission: RE | Admit: 2024-03-22 | Discharge: 2024-03-22 | Disposition: A | Discharge status: Home / Self Care | Source: Ambulatory Visit | Attending: Obstetrics and Gynecology | Admitting: Obstetrics and Gynecology

## 2024-03-22 DIAGNOSIS — Z1231 Encounter for screening mammogram for malignant neoplasm of breast: Secondary | ICD-10-CM
